# Patient Record
Sex: Female | Born: 1970 | Race: Black or African American | Hispanic: No | Marital: Married | Smoking: Never smoker
Health system: Southern US, Community
[De-identification: ages and names within clinical notes are randomized; demographics above are authoritative.]

## PROBLEM LIST (undated history)

## (undated) DIAGNOSIS — D259 Leiomyoma of uterus, unspecified: Secondary | ICD-10-CM

## (undated) HISTORY — PX: NO PAST SURGERIES: SHX2092

---

## 2006-09-26 ENCOUNTER — Encounter: Admission: RE | Admit: 2006-09-26 | Discharge: 2006-09-26 | Payer: Self-pay | Admitting: Internal Medicine

## 2009-08-28 ENCOUNTER — Encounter (INDEPENDENT_AMBULATORY_CARE_PROVIDER_SITE_OTHER): Payer: Self-pay | Admitting: Family Medicine

## 2009-08-28 ENCOUNTER — Ambulatory Visit: Payer: Self-pay | Admitting: Family Medicine

## 2009-08-28 LAB — CONVERTED CEMR LAB
Alkaline Phosphatase: 42 units/L (ref 39–117)
BUN: 8 mg/dL (ref 6–23)
CO2: 21 meq/L (ref 19–32)
Eosinophils Absolute: 0.2 10*3/uL (ref 0.0–0.7)
Eosinophils Relative: 2 % (ref 0–5)
Glucose, Bld: 94 mg/dL (ref 70–99)
HCT: 36.2 % (ref 36.0–46.0)
Hemoglobin: 12.2 g/dL (ref 12.0–15.0)
Lymphocytes Relative: 44 % (ref 12–46)
Lymphs Abs: 3.4 10*3/uL (ref 0.7–4.0)
MCV: 92.3 fL (ref 78.0–100.0)
Monocytes Absolute: 0.5 10*3/uL (ref 0.1–1.0)
Monocytes Relative: 6 % (ref 3–12)
Platelets: 298 10*3/uL (ref 150–400)
RBC: 3.92 M/uL (ref 3.87–5.11)
Total Bilirubin: 0.2 mg/dL — ABNORMAL LOW (ref 0.3–1.2)
WBC: 7.8 10*3/uL (ref 4.0–10.5)

## 2011-01-22 ENCOUNTER — Emergency Department (HOSPITAL_COMMUNITY)
Admission: EM | Admit: 2011-01-22 | Discharge: 2011-01-23 | Disposition: A | Discharge status: Home / Self Care | Payer: Self-pay | Attending: Emergency Medicine | Admitting: Emergency Medicine

## 2011-01-22 ENCOUNTER — Encounter: Payer: Self-pay | Admitting: Emergency Medicine

## 2011-01-22 DIAGNOSIS — J029 Acute pharyngitis, unspecified: Secondary | ICD-10-CM | POA: Insufficient documentation

## 2011-01-22 DIAGNOSIS — R51 Headache: Secondary | ICD-10-CM | POA: Insufficient documentation

## 2011-01-22 DIAGNOSIS — H9209 Otalgia, unspecified ear: Secondary | ICD-10-CM | POA: Insufficient documentation

## 2011-01-22 MED ORDER — SODIUM CHLORIDE 0.9 % IV BOLUS (SEPSIS)
1000.0000 mL | Freq: Once | INTRAVENOUS | Status: AC
  Administered 2011-01-22: 1000 mL via INTRAVENOUS

## 2011-01-22 NOTE — ED Notes (Signed)
Patient complaining of headache at this time and requested some tylenol.  MD aware.

## 2011-01-22 NOTE — ED Provider Notes (Signed)
History     CSN: [id number] Arrival date & time: 01/22/2011 10:22 PM   First MD Initiated Contact with Patient 01/22/11 2326      Chief Complaint  Patient presents with  . Sore Throat    (Consider location/radiation/quality/duration/timing/severity/associated sxs/prior treatment) HPI Is a 40 year old black female with a one and a half week history of sore throat. The pain is moderate to severe and located in the right side of the throat. She states it feels as if there is a lump there. The pain is worse with swallowing. The pain also radiates to the right ear. She's not aware of having a fever but her temperature was noted to be 99 7 here. She's had occasional headache with this. She's had decreased eating but states she believes she has been drinking well. She denies weakness or fatigue. She denies abdominal pain. She denies swollen glands. She was started on Zithromax 3 days ago without relief.  History reviewed. No pertinent past medical history.  History reviewed. No pertinent past surgical history.  No family history on file.  History  Substance Use Topics  . Smoking status: Never Smoker   . Smokeless tobacco: Not on file  . Alcohol Use: No    OB History    Grav Para Term Preterm Abortions TAB SAB Ect Mult Living                  Review of Systems  All other systems reviewed and are negative.    Allergies  Review of patient's allergies indicates no known allergies.  Home Medications   Current Outpatient Rx  Name Route Sig Dispense Refill  . AZITHROMYCIN 250 MG PO TABS Oral Take 250 mg by mouth daily. Take 2 on day 1, then 1 tab on days 2-5. Started on 01/19/11     . NORGESTIM-ETH ESTRAD TRIPHASIC 0.18/0.215/0.25 MG-25 MCG PO TABS Oral Take 1 tablet by mouth daily.        BP 118/79  Pulse 115  Temp 99.7 F (37.6 C)  Resp 14  SpO2 97%  LMP 12/24/2010  Physical Exam General: Well-developed, well-nourished female in no acute distress; appearance  consistent with age of record HENT: normocephalic, atraumatic; pharyngeal erythema and mild edema without exudate Eyes: pupils equal round and reactive to light; extraocular muscles intact Neck: supple; no lymphadenopathy Heart: regular rate and rhythm; no murmurs; tachycardic Lungs: clear to auscultation bilaterally Abdomen: soft; nontender; nondistended; no masses or hepatosplenomegaly; bowel sounds present Extremities: No deformity; full range of motion Neurologic: Awake, alert and oriented; motor function intact in all extremities and symmetric; no facial droop Skin: Warm and dry     ED Course  Procedures (including critical care time)    MDM   Nursing notes and vitals signs, including pulse oximetry, reviewed.  Summary of this visit's results, reviewed by myself:  Labs:  Results for orders placed during the hospital encounter of 01/22/11  RAPID STREP SCREEN      Component Value Range   Streptococcus, Group A Screen (Direct) NEGATIVE  NEGATIVE   MONONUCLEOSIS SCREEN      Component Value Range   Mono Screen NEGATIVE  NEGATIVE    12:38 AM Patient has a history of GERD in the past. GERD Some of her symptomatology although this would not be expected to cause a low-grade fever. We will administer dexamethasone in place her on a PPI. She was advised to finish her Zithromax course.          Crimson Dubberly L  Dagny Fiorentino, MD 01/23/11 1610

## 2011-01-22 NOTE — ED Notes (Signed)
PT. REPORTS SORE THROAT FOR 1 1/2 WEEKS CURRENTLY TAKING AZITHROMAX ANTIBIOTIC PRESCRIBED BY PCP WITH NO IMPROVEMENT.

## 2011-01-23 LAB — RAPID STREP SCREEN (MED CTR MEBANE ONLY): Streptococcus, Group A Screen (Direct): NEGATIVE

## 2011-01-23 LAB — MONONUCLEOSIS SCREEN: Mono Screen: NEGATIVE

## 2011-01-23 MED ORDER — LANSOPRAZOLE 15 MG PO CPDR: 15.0000 mg | DELAYED_RELEASE_CAPSULE | Freq: Every day | ORAL | Status: DC

## 2011-01-23 MED ORDER — DEXAMETHASONE SODIUM PHOSPHATE 10 MG/ML IJ SOLN
10.0000 mg | Freq: Once | INTRAMUSCULAR | Status: AC
  Administered 2011-01-23: 10 mg via INTRAVENOUS
  Filled 2011-01-23: qty 1

## 2011-01-23 MED ORDER — ACETAMINOPHEN 325 MG PO TABS
650.0000 mg | ORAL_TABLET | Freq: Once | ORAL | Status: AC
  Administered 2011-01-23: 650 mg via ORAL

## 2011-01-23 MED ORDER — ACETAMINOPHEN 325 MG PO TABS
ORAL_TABLET | ORAL | Status: AC
  Filled 2011-01-23: qty 2

## 2011-01-23 MED ORDER — PANTOPRAZOLE SODIUM 40 MG IV SOLR
40.0000 mg | Freq: Once | INTRAVENOUS | Status: AC
  Administered 2011-01-23: 40 mg via INTRAVENOUS
  Filled 2011-01-23: qty 40

## 2014-02-28 ENCOUNTER — Other Ambulatory Visit (HOSPITAL_COMMUNITY)
Admission: RE | Admit: 2014-02-28 | Discharge: 2014-02-28 | Disposition: A | Discharge status: Home / Self Care | Payer: BLUE CROSS/BLUE SHIELD | Source: Ambulatory Visit | Attending: Obstetrics & Gynecology | Admitting: Obstetrics & Gynecology

## 2014-02-28 ENCOUNTER — Other Ambulatory Visit: Payer: Self-pay | Admitting: Obstetrics & Gynecology

## 2014-02-28 DIAGNOSIS — Z01411 Encounter for gynecological examination (general) (routine) with abnormal findings: Secondary | ICD-10-CM | POA: Diagnosis present

## 2014-02-28 DIAGNOSIS — Z1151 Encounter for screening for human papillomavirus (HPV): Secondary | ICD-10-CM | POA: Diagnosis present

## 2014-03-05 LAB — CYTOLOGY - PAP

## 2015-12-17 ENCOUNTER — Other Ambulatory Visit: Payer: Self-pay | Admitting: Obstetrics & Gynecology

## 2015-12-17 DIAGNOSIS — Z1231 Encounter for screening mammogram for malignant neoplasm of breast: Secondary | ICD-10-CM

## 2015-12-31 ENCOUNTER — Ambulatory Visit
Admission: RE | Admit: 2015-12-31 | Discharge: 2015-12-31 | Disposition: A | Discharge status: Home / Self Care | Payer: BLUE CROSS/BLUE SHIELD | Source: Ambulatory Visit | Attending: Obstetrics & Gynecology | Admitting: Obstetrics & Gynecology

## 2015-12-31 DIAGNOSIS — Z1231 Encounter for screening mammogram for malignant neoplasm of breast: Secondary | ICD-10-CM

## 2018-03-31 ENCOUNTER — Other Ambulatory Visit: Payer: Self-pay | Admitting: Obstetrics & Gynecology

## 2018-03-31 DIAGNOSIS — Z1231 Encounter for screening mammogram for malignant neoplasm of breast: Secondary | ICD-10-CM

## 2018-05-04 ENCOUNTER — Ambulatory Visit: Payer: BLUE CROSS/BLUE SHIELD

## 2019-09-19 ENCOUNTER — Other Ambulatory Visit: Payer: Self-pay | Admitting: Obstetrics & Gynecology

## 2019-09-19 DIAGNOSIS — Z1231 Encounter for screening mammogram for malignant neoplasm of breast: Secondary | ICD-10-CM

## 2019-10-04 ENCOUNTER — Other Ambulatory Visit: Payer: Self-pay

## 2019-10-04 ENCOUNTER — Ambulatory Visit
Admission: RE | Admit: 2019-10-04 | Discharge: 2019-10-04 | Disposition: A | Discharge status: Home / Self Care | Payer: No Typology Code available for payment source | Source: Ambulatory Visit | Attending: Obstetrics & Gynecology | Admitting: Obstetrics & Gynecology

## 2019-10-04 DIAGNOSIS — Z1231 Encounter for screening mammogram for malignant neoplasm of breast: Secondary | ICD-10-CM

## 2020-03-19 ENCOUNTER — Other Ambulatory Visit: Payer: Self-pay | Admitting: Internal Medicine

## 2020-03-20 LAB — COMPLETE METABOLIC PANEL WITH GFR
AG Ratio: 1.3 (calc) (ref 1.0–2.5)
ALT: 15 U/L (ref 6–29)
AST: 16 U/L (ref 10–35)
Albumin: 4.2 g/dL (ref 3.6–5.1)
Alkaline phosphatase (APISO): 38 U/L (ref 31–125)
BUN: 10 mg/dL (ref 7–25)
CO2: 26 mmol/L (ref 20–32)
Calcium: 9.5 mg/dL (ref 8.6–10.2)
Chloride: 101 mmol/L (ref 98–110)
Creat: 0.69 mg/dL (ref 0.50–1.10)
GFR, Est African American: 119 mL/min/{1.73_m2} (ref >=60)
GFR, Est Non African American: 103 mL/min/{1.73_m2} (ref >=60)
Globulin: 3.3 g/dL (calc) (ref 1.9–3.7)
Glucose, Bld: 73 mg/dL (ref 65–99)
Potassium: 4.4 mmol/L (ref 3.5–5.3)
Sodium: 134 mmol/L — ABNORMAL LOW (ref 135–146)
Total Bilirubin: 0.6 mg/dL (ref 0.2–1.2)
Total Protein: 7.5 g/dL (ref 6.1–8.1)

## 2020-03-20 LAB — IRON, TOTAL/TOTAL IRON BINDING CAP
%SAT: 35 % (calc) (ref 16–45)
Iron: 160 ug/dL (ref 40–190)
TIBC: 462 mcg/dL (calc) — ABNORMAL HIGH (ref 250–450)

## 2020-03-20 LAB — TSH: TSH: 1.92 mIU/L

## 2020-03-20 LAB — CBC
HCT: 40.2 % (ref 35.0–45.0)
Hemoglobin: 13.7 g/dL (ref 11.7–15.5)
MCH: 31.2 pg (ref 27.0–33.0)
MCHC: 34.1 g/dL (ref 32.0–36.0)
MCV: 91.6 fL (ref 80.0–100.0)
MPV: 10 fL (ref 7.5–12.5)
Platelets: 308 10*3/uL (ref 140–400)
RBC: 4.39 10*6/uL (ref 3.80–5.10)
RDW: 12.5 % (ref 11.0–15.0)
WBC: 5.8 10*3/uL (ref 3.8–10.8)

## 2020-03-20 LAB — LIPID PANEL
Cholesterol: 167 mg/dL (ref <200)
HDL: 67 mg/dL (ref >=50)
LDL Cholesterol (Calc): 88 mg/dL (calc)
Non-HDL Cholesterol (Calc): 100 mg/dL (calc) (ref <130)
Total CHOL/HDL Ratio: 2.5 (calc) (ref <5.0)
Triglycerides: 40 mg/dL (ref <150)

## 2020-03-20 LAB — FERRITIN: Ferritin: 35 ng/mL (ref 16–232)

## 2020-03-20 LAB — RETICULOCYTES
ABS Retic: 61460 cells/uL (ref 20000–8000)
Retic Ct Pct: 1.4 %

## 2020-03-20 LAB — SICKLE CELL SCREEN: Sickle Solubility Test - HGBRFX: NEGATIVE

## 2020-03-20 LAB — VITAMIN D 25 HYDROXY (VIT D DEFICIENCY, FRACTURES): Vit D, 25-Hydroxy: 14 ng/mL — ABNORMAL LOW (ref 30–100)

## 2020-03-20 LAB — B12 AND FOLATE PANEL
Folate: 17.3 ng/mL
Vitamin B-12: 409 pg/mL (ref 200–1100)

## 2021-05-18 ENCOUNTER — Other Ambulatory Visit: Payer: Self-pay | Admitting: Internal Medicine

## 2021-05-18 ENCOUNTER — Other Ambulatory Visit: Payer: Self-pay | Admitting: Obstetrics & Gynecology

## 2021-05-18 DIAGNOSIS — Z1231 Encounter for screening mammogram for malignant neoplasm of breast: Secondary | ICD-10-CM

## 2021-05-29 ENCOUNTER — Ambulatory Visit
Admission: RE | Admit: 2021-05-29 | Discharge: 2021-05-29 | Disposition: A | Discharge status: Home / Self Care | Payer: No Typology Code available for payment source | Source: Ambulatory Visit | Attending: Internal Medicine | Admitting: Internal Medicine

## 2021-05-29 ENCOUNTER — Other Ambulatory Visit: Payer: Self-pay | Admitting: Pediatrics

## 2021-05-29 DIAGNOSIS — Z1231 Encounter for screening mammogram for malignant neoplasm of breast: Secondary | ICD-10-CM

## 2021-12-17 ENCOUNTER — Emergency Department (HOSPITAL_COMMUNITY): Payer: No Typology Code available for payment source

## 2021-12-17 ENCOUNTER — Emergency Department (HOSPITAL_COMMUNITY)
Admission: EM | Admit: 2021-12-17 | Discharge: 2021-12-17 | Disposition: A | Discharge status: Home / Self Care | Payer: No Typology Code available for payment source | Attending: Emergency Medicine | Admitting: Emergency Medicine

## 2021-12-17 ENCOUNTER — Other Ambulatory Visit: Payer: Self-pay

## 2021-12-17 ENCOUNTER — Encounter (HOSPITAL_COMMUNITY): Payer: Self-pay

## 2021-12-17 DIAGNOSIS — R7401 Elevation of levels of liver transaminase levels: Secondary | ICD-10-CM | POA: Insufficient documentation

## 2021-12-17 DIAGNOSIS — R160 Hepatomegaly, not elsewhere classified: Secondary | ICD-10-CM | POA: Insufficient documentation

## 2021-12-17 DIAGNOSIS — R197 Diarrhea, unspecified: Secondary | ICD-10-CM | POA: Insufficient documentation

## 2021-12-17 DIAGNOSIS — R110 Nausea: Secondary | ICD-10-CM | POA: Insufficient documentation

## 2021-12-17 DIAGNOSIS — R1013 Epigastric pain: Secondary | ICD-10-CM | POA: Insufficient documentation

## 2021-12-17 HISTORY — DX: Leiomyoma of uterus, unspecified: D25.9

## 2021-12-17 LAB — COMPREHENSIVE METABOLIC PANEL
ALT: 90 U/L — ABNORMAL HIGH (ref 0–44)
AST: 72 U/L — ABNORMAL HIGH (ref 15–41)
Albumin: 3.9 g/dL (ref 3.5–5.0)
Alkaline Phosphatase: 37 U/L — ABNORMAL LOW (ref 38–126)
Anion gap: 7 (ref 5–15)
BUN: 8 mg/dL (ref 6–20)
CO2: 25 mmol/L (ref 22–32)
Calcium: 9.2 mg/dL (ref 8.9–10.3)
Chloride: 107 mmol/L (ref 98–111)
Creatinine, Ser: 0.73 mg/dL (ref 0.44–1.00)
GFR, Estimated: >60 mL/min (ref >60)
Glucose, Bld: 110 mg/dL — ABNORMAL HIGH (ref 70–99)
Potassium: 4 mmol/L (ref 3.5–5.1)
Sodium: 139 mmol/L (ref 135–145)
Total Bilirubin: 0.3 mg/dL (ref 0.3–1.2)
Total Protein: 7.3 g/dL (ref 6.5–8.1)

## 2021-12-17 LAB — CBC WITH DIFFERENTIAL/PLATELET
Abs Immature Granulocytes: 0.03 10*3/uL (ref 0.00–0.07)
Basophils Absolute: 0 10*3/uL (ref 0.0–0.1)
Basophils Relative: 1 %
Eosinophils Absolute: 0 10*3/uL (ref 0.0–0.5)
Eosinophils Relative: 0 %
HCT: 37.3 % (ref 36.0–46.0)
Hemoglobin: 12.7 g/dL (ref 12.0–15.0)
Immature Granulocytes: 1 %
Lymphocytes Relative: 21 %
Lymphs Abs: 1.4 10*3/uL (ref 0.7–4.0)
MCH: 32.2 pg (ref 26.0–34.0)
MCHC: 34 g/dL (ref 30.0–36.0)
MCV: 94.4 fL (ref 80.0–100.0)
Monocytes Absolute: 0.3 10*3/uL (ref 0.1–1.0)
Monocytes Relative: 4 %
Neutro Abs: 4.9 10*3/uL (ref 1.7–7.7)
Neutrophils Relative %: 73 %
Platelets: 256 10*3/uL (ref 150–400)
RBC: 3.95 MIL/uL (ref 3.87–5.11)
RDW: 12.5 % (ref 11.5–15.5)
WBC: 6.6 10*3/uL (ref 4.0–10.5)
nRBC: 0 % (ref 0.0–0.2)

## 2021-12-17 LAB — LIPASE, BLOOD: Lipase: 30 U/L (ref 11–51)

## 2021-12-17 LAB — URINALYSIS, ROUTINE W REFLEX MICROSCOPIC
Bilirubin Urine: NEGATIVE
Glucose, UA: NEGATIVE mg/dL
Hgb urine dipstick: NEGATIVE
Ketones, ur: NEGATIVE mg/dL
Nitrite: NEGATIVE
Protein, ur: 30 mg/dL — AB
Specific Gravity, Urine: 1.018 (ref 1.005–1.030)
pH: 7 (ref 5.0–8.0)

## 2021-12-17 LAB — TROPONIN I (HIGH SENSITIVITY)
Troponin I (High Sensitivity): 6 ng/L (ref <18)
Troponin I (High Sensitivity): 6 ng/L (ref <18)

## 2021-12-17 MED ORDER — IOHEXOL 350 MG/ML SOLN
70.0000 mL | Freq: Once | INTRAVENOUS | Status: AC | PRN
  Administered 2021-12-17: 70 mL via INTRAVENOUS

## 2021-12-17 MED ORDER — TRAMADOL HCL 50 MG PO TABS: 50.0000 mg | ORAL_TABLET | Freq: Four times a day (QID) | ORAL | 0 refills | Status: DC | PRN

## 2021-12-17 MED ORDER — ONDANSETRON HCL 4 MG/2ML IJ SOLN
4.0000 mg | Freq: Once | INTRAMUSCULAR | Status: AC
  Administered 2021-12-17: 4 mg via INTRAVENOUS
  Filled 2021-12-17 (×2): qty 2

## 2021-12-17 MED ORDER — OXYCODONE HCL 5 MG PO TABS: 5.0000 mg | ORAL_TABLET | Freq: Four times a day (QID) | ORAL | 0 refills | Status: DC | PRN

## 2021-12-17 MED ORDER — SODIUM CHLORIDE 0.9 % IV BOLUS
1000.0000 mL | Freq: Once | INTRAVENOUS | Status: AC
  Administered 2021-12-17: 1000 mL via INTRAVENOUS

## 2021-12-17 MED ORDER — ACETAMINOPHEN 325 MG PO TABS
650.0000 mg | ORAL_TABLET | Freq: Once | ORAL | Status: AC
  Administered 2021-12-17: 650 mg via ORAL
  Filled 2021-12-17: qty 2

## 2021-12-17 MED ORDER — KETOROLAC TROMETHAMINE 15 MG/ML IJ SOLN
15.0000 mg | Freq: Once | INTRAMUSCULAR | Status: AC
  Administered 2021-12-17: 15 mg via INTRAVENOUS
  Filled 2021-12-17: qty 1

## 2021-12-17 MED ORDER — TRAMADOL HCL 50 MG PO TABS
50.0000 mg | ORAL_TABLET | Freq: Once | ORAL | Status: AC
  Administered 2021-12-17: 50 mg via ORAL
  Filled 2021-12-17: qty 1

## 2021-12-17 MED ORDER — ONDANSETRON 4 MG PO TBDP: ORAL_TABLET | ORAL | 0 refills | Status: DC

## 2021-12-17 MED ORDER — OXYCODONE HCL 5 MG PO TABS
5.0000 mg | ORAL_TABLET | Freq: Once | ORAL | Status: DC
  Filled 2021-12-17: qty 1

## 2021-12-17 NOTE — ED Notes (Signed)
Pt reported feeling nausea, requested previously refused ondansetron

## 2021-12-17 NOTE — ED Provider Triage Note (Signed)
Emergency Medicine Provider Triage Evaluation Note  Carol Chan , a 51 y.o. female  was evaluated in triage.  Pt complains of epigastric pain.  Patient notes acute onset epigastric pain around 10/10:30 this morning when she was sitting at her desk.  She notes persistence of symptoms since onset.  Pain is described as cramping/pressure without radiation.  Denies associated nausea, vomiting, urinary/vaginal symptoms, change in bowel habits.  Denies chest pain, shortness of breath.  Denies any known relieving or exacerbating factors.  Denies fever, chills, night sweats.  She has taken no medication for this. Review of Systems  Positive: See above Negative:   Physical Exam  BP (!) 163/83 (BP Location: Right Arm)   Pulse 88   Temp 98.8 F (37.1 C) (Oral)   Resp 17   Ht '5\' 6"'$  (1.676 m)   Wt 75.3 kg   LMP 11/04/2021   SpO2 98%   BMI 26.79 kg/m  Gen:   Awake, no distress   Resp:  Normal effort  MSK:   Moves extremities without difficulty  Other:  No epigastric tenderness to palpation.  No other abdominal tenderness to palpation.  No CVA tenderness bilaterally.  No lower extremity edema noted.  Lungs clear to auscultation bilaterally.  No obvious murmurs gallops or rubs.  Medical Decision Making  Medically screening exam initiated at 1:45 PM.  Appropriate orders placed.  Lannie Heaps was informed that the remainder of the evaluation will be completed by another provider, this initial triage assessment does not replace that evaluation, and the importance of remaining in the ED until their evaluation is complete.     Wilnette Kales, [place] 12/17/21 1348

## 2021-12-17 NOTE — ED Triage Notes (Signed)
Pt c/o sudden onset epigastric pain that started at 10am. Pt denies N/V/D, or ShOB. Pt state she was sitting down at work when the pain started.

## 2021-12-17 NOTE — ED Provider Notes (Signed)
[facility] EMERGENCY DEPARTMENT Provider Note   CSN: [id number] Arrival date & time: 12/17/21  1304     History  Chief Complaint  Patient presents with   Abdominal Pain    Carol Chan is a 51 y.o. female here presenting with abdominal pain.  Patient states that she has not been feeling well this morning.  She states that she had a cute onset of epigastric pain and general abdominal cramps.  She states that she has been having some loose stools and nausea as well.  Patient denies any fever and denies eating any uncooked food.  The history is provided by the patient.       Home Medications Prior to Admission medications   Medication Sig Start Date End Date Taking? Authorizing Provider  azithromycin (ZITHROMAX) 250 MG tablet Take 250 mg by mouth daily. Take 2 on day 1, then 1 tab on days 2-5. Started on 01/19/11     [provider]  lansoprazole (PREVACID 24HR) 15 MG capsule Take 1 capsule (15 mg total) by mouth daily. 01/23/11 01/23/12  Molpus, John, MD  Norgestimate-Ethinyl Estradiol Triphasic (ORTHO TRI-CYCLEN LO) 0.18/0.215/0.25 MG-25 MCG tablet Take 1 tablet by mouth daily.      [provider]      Allergies    Patient has no known allergies.    Review of Systems   Review of Systems  Gastrointestinal:  Positive for abdominal pain.  All other systems reviewed and are negative.   Physical Exam Updated Vital Signs BP (!) 159/83   Pulse 79   Temp 98.8 F (37.1 C) (Oral)   Resp 20   Ht '5\' 6"'$  (1.676 m)   Wt 75.3 kg   LMP 11/04/2021   SpO2 100%   BMI 26.79 kg/m  Physical Exam Vitals and nursing note reviewed.  Constitutional:      Comments: Slightly uncomfortable and dehydrated  HENT:     Head: Normocephalic.     Mouth/Throat:     Pharynx: Oropharynx is clear.  Eyes:     Extraocular Movements: Extraocular movements intact.     Pupils: Pupils are equal, round, and reactive to light.  Cardiovascular:     Rate and Rhythm:  Normal rate and regular rhythm.  Pulmonary:     Effort: Pulmonary effort is normal.     Breath sounds: Normal breath sounds.  Abdominal:     General: Abdomen is flat.     Comments: Mild periumbilical tenderness  Skin:    General: Skin is warm.     Capillary Refill: Capillary refill takes less than 2 seconds.  Neurological:     General: No focal deficit present.     Mental Status: She is alert and oriented to person, place, and time.  Psychiatric:        Mood and Affect: Mood normal.        Behavior: Behavior normal.     ED Results / Procedures / Treatments   Labs (all labs ordered are listed, but only abnormal results are displayed) Labs Reviewed  COMPREHENSIVE METABOLIC PANEL - Abnormal; Notable for the following components:      Result Value   Glucose, Bld 110 (*)    AST 72 (*)    ALT 90 (*)    Alkaline Phosphatase 37 (*)    All other components within normal limits  URINALYSIS, ROUTINE W REFLEX MICROSCOPIC - Abnormal; Notable for the following components:   APPearance HAZY (*)    Protein, ur 30 (*)  Leukocytes,Ua TRACE (*)    Bacteria, UA RARE (*)    All other components within normal limits  CBC WITH DIFFERENTIAL/PLATELET  LIPASE, BLOOD  TROPONIN I (HIGH SENSITIVITY)  TROPONIN I (HIGH SENSITIVITY)    EKG EKG Interpretation  Date/Time:  Thursday December 17 2021 13:29:35 EST Ventricular Rate:  83 PR Interval:  168 QRS Duration: 84 QT Interval:  388 QTC Calculation: 455 R Axis:   10 Text Interpretation: Normal sinus rhythm Normal ECG No previous ECGs available Confirmed by Wandra Arthurs [phone]) on 12/17/2021 3:54:58 PM  Radiology CT ABDOMEN PELVIS W CONTRAST  Result Date: 12/17/2021 CLINICAL DATA:  Nausea and vomiting. Epigastric pain. EXAM: CT ABDOMEN AND PELVIS WITH CONTRAST TECHNIQUE: Multidetector CT imaging of the abdomen and pelvis was performed using the standard protocol following bolus administration of intravenous contrast. RADIATION DOSE  REDUCTION: This exam was performed according to the departmental dose-optimization program which includes automated exposure control, adjustment of the mA and/or kV according to patient size and/or use of iterative reconstruction technique. CONTRAST:  70m OMNIPAQUE IOHEXOL 350 MG/ML SOLN COMPARISON:  Remote ultrasound 09/26/2006 FINDINGS: Lower chest: No focal airspace disease or pleural effusion. Hepatobiliary: Large ill-defined heterogeneous lesion in the right hepatic lobe is irregular in shape, however measures 8.2 x 8.2 cm, series 3, image 23. There is some slightly nodular enhancement. No additional liver lesion. Gallbladder physiologically distended, no calcified stone. No biliary dilatation. Pancreas: Unremarkable. No pancreatic ductal dilatation or surrounding inflammatory changes. Spleen: Normal in size without focal abnormality. Adrenals/Urinary Tract: Normal adrenal glands. No hydronephrosis or perinephric edema. Homogeneous renal enhancement with symmetric excretion on delayed phase imaging. No renal calculi. There are small bilateral low-density lesions in both kidneys are too small to characterize but typically cysts. No specific imaging follow-up is needed. Urinary bladder is minimally distended. Stomach/Bowel: Fluid distended stomach. No gastric wall thickening. No small bowel obstruction or inflammation. Normal appendix. Small volume of colonic stool. Majority of the distal transverse and descending colon are decompressed limiting assessment. There is no obvious colonic mass. Vascular/Lymphatic: Normal caliber abdominal aorta. Patent portal and splenic veins. No bulky abdominopelvic adenopathy. Periportal nodes are not enlarged by size criteria. Reproductive: The uterus is enlarged spanning 18 cm cranial caudal to the level of the umbilicus. Diffuse uterine fibroids. The ovaries are not well-defined by CT. Other: No ascites. No free air. Tiny fat containing umbilical hernia. Musculoskeletal: There  are no acute or suspicious osseous abnormalities. Transitional lumbosacral anatomy. IMPRESSION: 1. Large ill-defined heterogeneous lesion in the right hepatic lobe measuring 8.2 x 8.2 cm. Recommend MRI characterization. No lesion was seen on remote 2008 ultrasound. 2. Enlarged fibroid uterus. Electronically Signed   By: MKeith RakeM.D.   On: 12/17/2021 19:09   DG Chest 1 View  Result Date: 12/17/2021 CLINICAL DATA:  Chest pain and abdominal pain for 1 day EXAM: CHEST  1 VIEW COMPARISON:  None Available. FINDINGS: Upper normal heart size. Mediastinal contours and pulmonary vascularity normal. Lungs clear. No infiltrate, pleural effusion, or pneumothorax. Osseous structures unremarkable. IMPRESSION: No acute abnormalities. Electronically Signed   By: MLavonia DanaM.D.   On: 12/17/2021 14:30    Procedures Procedures    Medications Ordered in ED Medications  acetaminophen (TYLENOL) tablet 650 mg (650 mg Oral Given 12/17/21 1432)  sodium chloride 0.9 % bolus 1,000 mL (1,000 mLs Intravenous New Bag/Given 12/17/21 1711)  ondansetron (ZOFRAN) injection 4 mg (4 mg Intravenous Given 12/17/21 1849)  ketorolac (TORADOL) 15 MG/ML injection 15 mg (15 mg Intravenous  Given 12/17/21 1734)  iohexol (OMNIPAQUE) 350 MG/ML injection 70 mL (70 mLs Intravenous Contrast Given 12/17/21 1842)    ED Course/ Medical Decision Making/ A&P                           Medical Decision Making Carol Chan is a 51 y.o. female here presenting with abdominal pain and nausea.  Likely viral gastroenteritis.  Plan to get CBC and CMP and lipase and CT abdomen pelvis.  Will hydrate and reassess.  7:29 PM Reviewed patient's labs and independently interpreted CT scan.  Labs showed minimally elevated LFTs.  Lipase is normal.  CT abdomen pelvis showed large ill-defined heterogeneous lesion in the right hepatic lobe.  I referred patient to GI to get MRI and possible colonoscopy or further biopsy.  Told her to avoid Tylenol for now and  take ibuprofen and oxycodone as needed.  Problems Addressed: Liver mass: acute illness or injury  Amount and/or Complexity of Data Reviewed Labs: ordered. Decision-making details documented in ED Course. Radiology: ordered and independent interpretation performed. Decision-making details documented in ED Course.  Risk Prescription drug management.    Final Clinical Impression(s) / ED Diagnoses Final diagnoses:  None    Rx / DC Orders ED Discharge Orders     None         Drenda Freeze, MD 12/17/21 1931

## 2021-12-17 NOTE — ED Notes (Signed)
Pt refusing oxycodone, requesting tramadol. Dr. Darl Householder aware.

## 2021-12-17 NOTE — Discharge Instructions (Addendum)
As we discussed, you have a liver mass on your CT scan.  I referred you to a GI doctor to get an MRI and further evaluation.  Your liver enzymes are slightly abnormal I want you to avoid Tylenol for now.  Take Motrin for pain and take Roxicodone for severe pain.  You can take tramadol instead of Roxicodone if you want   You can take Zofran for nausea  Stay hydrated  See your doctor for follow-up and please call GI doctor for follow-up  Return to ER if you have severe abdominal pain, vomiting, fever

## 2021-12-17 NOTE — ED Notes (Signed)
Pt updated on plan of care while in lobby

## 2021-12-18 ENCOUNTER — Encounter: Payer: Self-pay | Admitting: Nurse Practitioner

## 2022-01-22 ENCOUNTER — Encounter: Payer: Self-pay | Admitting: Nurse Practitioner

## 2022-01-22 ENCOUNTER — Ambulatory Visit (INDEPENDENT_AMBULATORY_CARE_PROVIDER_SITE_OTHER): Payer: No Typology Code available for payment source | Admitting: Nurse Practitioner

## 2022-01-22 ENCOUNTER — Other Ambulatory Visit (INDEPENDENT_AMBULATORY_CARE_PROVIDER_SITE_OTHER): Payer: No Typology Code available for payment source

## 2022-01-22 VITALS — BP 128/72 | HR 95 | Ht 66.0 in | Wt 164.0 lb

## 2022-01-22 DIAGNOSIS — Z1211 Encounter for screening for malignant neoplasm of colon: Secondary | ICD-10-CM

## 2022-01-22 DIAGNOSIS — R7989 Other specified abnormal findings of blood chemistry: Secondary | ICD-10-CM

## 2022-01-22 DIAGNOSIS — R160 Hepatomegaly, not elsewhere classified: Secondary | ICD-10-CM

## 2022-01-22 LAB — COMPREHENSIVE METABOLIC PANEL
ALT: 21 U/L (ref 0–35)
AST: 15 U/L (ref 0–37)
Albumin: 4 g/dL (ref 3.5–5.2)
Alkaline Phosphatase: 50 U/L (ref 39–117)
BUN: 10 mg/dL (ref 6–23)
CO2: 26 mEq/L (ref 19–32)
Calcium: 9.5 mg/dL (ref 8.4–10.5)
Chloride: 103 mEq/L (ref 96–112)
Creatinine, Ser: 0.74 mg/dL (ref 0.40–1.20)
GFR: 94.32 mL/min (ref >60.00)
Glucose, Bld: 89 mg/dL (ref 70–99)
Potassium: 3.6 mEq/L (ref 3.5–5.1)
Sodium: 135 mEq/L (ref 135–145)
Total Bilirubin: 0.3 mg/dL (ref 0.2–1.2)
Total Protein: 7.8 g/dL (ref 6.0–8.3)

## 2022-01-22 LAB — CBC WITH DIFFERENTIAL/PLATELET
Basophils Absolute: 0.1 10*3/uL (ref 0.0–0.1)
Basophils Relative: 1.3 % (ref 0.0–3.0)
Eosinophils Absolute: 0.2 10*3/uL (ref 0.0–0.7)
Eosinophils Relative: 2.5 % (ref 0.0–5.0)
HCT: 38.2 % (ref 36.0–46.0)
Hemoglobin: 12.6 g/dL (ref 12.0–15.0)
Lymphocytes Relative: 30.9 % (ref 12.0–46.0)
Lymphs Abs: 2.1 10*3/uL (ref 0.7–4.0)
MCHC: 33 g/dL (ref 30.0–36.0)
MCV: 92.5 fl (ref 78.0–100.0)
Monocytes Absolute: 0.4 10*3/uL (ref 0.1–1.0)
Monocytes Relative: 6.3 % (ref 3.0–12.0)
Neutro Abs: 3.9 10*3/uL (ref 1.4–7.7)
Neutrophils Relative %: 59 % (ref 43.0–77.0)
Platelets: 356 10*3/uL (ref 150.0–400.0)
RBC: 4.13 Mil/uL (ref 3.87–5.11)
RDW: 12.9 % (ref 11.5–15.5)
WBC: 6.6 10*3/uL (ref 4.0–10.5)

## 2022-01-22 LAB — PROTIME-INR
INR: 1.1 ratio — ABNORMAL HIGH (ref 0.8–1.0)
Prothrombin Time: 12.5 s (ref 9.6–13.1)

## 2022-01-22 NOTE — Progress Notes (Signed)
01/22/2022 Carol Chan [id number] 02-Feb-1971   CHIEF COMPLAINT: Abdominal pain.  HISTORY OF PRESENT ILLNESS: Carol Chan is a 51 year female with a past medical history of uterine fibroids otherwise noncontributory.  No past surgical history.  She presents to our office today as referred by Dr. Shirlyn Chan for further evaluation regarding a right liver mass and to schedule a screening colonoscopy. 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  Laboratory studies in the ED showed a WBC count of 6.6.  Hemoglobin 12.7. Alk phos 37.  AST 72.  ALT 90.  Total bili 0.3.  CTAP with contrast identified a large liver mass measuring 8.2 x 8.2 cm with nodular enhancement and diffuse uterine fibroids.  No colon mass.  She received IV fluids, Zofran, Toradol and her abdominal pain abated.  She was discharged home with instructions to follow-up with GI to further evaluate her liver mass and to schedule a screening colonoscopy.  She denies having any further abdominal pain since her ED evaluation. She denies having any history of liver lesions/FNH or liver disease in the past.  She has been on oral birth control pills since for 29 years.  No alcohol or drug use.  No family history of liver disease.  No fever, sweats or chills.  No unexplained weight loss.  No nausea or vomiting.  No heartburn or dysphagia.  He is passing normal formed brown bowel movement once daily.  Her half brother was diagnosed with colon cancer in his 101s.  She denies ever a screening colonoscopy.     Latest Ref Rng & Units 12/17/2021    1:50 PM 03/19/2020    2:50 PM 08/28/2009    9:38 PM  CBC  WBC 4.0 - 10.5 K/uL 6.6  5.8  7.8   Hemoglobin 12.0 - 15.0 g/dL 12.7  13.7  12.2   Hematocrit 36.0 - 46.0 % 37.3  40.2  36.2   Platelets 150 - 400 K/uL 256  308  298        Latest Ref Rng & Units 12/17/2021    1:50 PM 03/19/2020    2:50 PM  08/28/2009    9:38 PM  CMP  Glucose 70 - 99 mg/dL 110  73  94   BUN 6 - 20 mg/dL _0 Creatinine 0.44 - 1.00 mg/dL 0.73  0.69  0.60   Sodium 135 - 145 mmol/L 139  134  139   Potassium 3.5 - 5.1 mmol/L 4.0  4.4  4.0   Chloride 98 - 111 mmol/L 107  101  106   CO2 22 - 32 mmol/L _1 Calcium 8.9 - 10.3 mg/dL 9.2  9.5  8.7   Total Protein 6.5 - 8.1 g/dL 7.3  7.5  7.0   Total Bilirubin 0.3 - 1.2 mg/dL 0.3  0.6  0.2   Alkaline Phos 38 - 126 U/L 37   42   AST 15 - 41 U/L 72  16  11   ALT 0 - 44 U/L 90  15  9     CTAP 12/17/2021:   EXAM: CT ABDOMEN AND PELVIS WITH CONTRAST   TECHNIQUE: Multidetector CT imaging of the abdomen and pelvis was performed using the standard protocol following bolus administration of intravenous contrast.   RADIATION DOSE REDUCTION: This exam was performed according to  the departmental dose-optimization program which includes automated exposure control, adjustment of the mA and/or kV according to patient size and/or use of iterative reconstruction technique.   CONTRAST:  35m OMNIPAQUE IOHEXOL 350 MG/ML SOLN   COMPARISON:  Remote ultrasound 09/26/2006   FINDINGS: Lower chest: No focal airspace disease or pleural effusion.   Hepatobiliary: Large ill-defined heterogeneous lesion in the right hepatic lobe is irregular in shape, however measures 8.2 x 8.2 cm, series 3, image 23. There is some slightly nodular enhancement. No additional liver lesion. Gallbladder physiologically distended, no calcified stone. No biliary dilatation.   Pancreas: Unremarkable. No pancreatic ductal dilatation or surrounding inflammatory changes.   Spleen: Normal in size without focal abnormality.   Adrenals/Urinary Tract: Normal adrenal glands. No hydronephrosis or perinephric edema. Homogeneous renal enhancement with symmetric excretion on delayed phase imaging. No renal calculi. There are small bilateral low-density lesions in both kidneys are too small  to characterize but typically cysts. No specific imaging follow-up is needed. Urinary bladder is minimally distended.   Stomach/Bowel: Fluid distended stomach. No gastric wall thickening. No small bowel obstruction or inflammation. Normal appendix. Small volume of colonic stool. Majority of the distal transverse and descending colon are decompressed limiting assessment. There is no obvious colonic mass.   Vascular/Lymphatic: Normal caliber abdominal aorta. Patent portal and splenic veins. No bulky abdominopelvic adenopathy. Periportal nodes are not enlarged by size criteria.   Reproductive: The uterus is enlarged spanning 18 cm cranial caudal to the level of the umbilicus. Diffuse uterine fibroids. The ovaries are not well-defined by CT.   Other: No ascites. No free air. Tiny fat containing umbilical hernia.   Musculoskeletal: There are no acute or suspicious osseous abnormalities. Transitional lumbosacral anatomy.   IMPRESSION: 1. Large ill-defined heterogeneous lesion in the right hepatic lobe measuring 8.2 x 8.2 cm. Recommend MRI characterization. No lesion was seen on remote 2008 ultrasound. 2. Enlarged fibroid uterus.   Past Medical History:  Diagnosis Date   Uterine fibroid    Past Surgical History:  Procedure Laterality Date   NO PAST SURGERIES     Social History: She is married.  She has 1 son and 2 daughters.  She is a rEquities trader  Non-smoker.  No alcohol use.  No drug use.  Family History: No family history of. Paternal cousin had colon cancer.  Half brother had colon cancer, diagnosed in late 539's   No Known Allergies    Outpatient Encounter Medications as of 01/22/2022  Medication Sig   Norgestimate-Ethinyl Estradiol Triphasic (ORTHO TRI-CYCLEN LO) 0.18/0.215/0.25 MG-25 MCG tablet Take 1 tablet by mouth daily.     [DISCONTINUED] azithromycin (ZITHROMAX) 250 MG tablet Take 250 mg by mouth daily. Take 2 on day 1, then 1 tab on days 2-5. Started on  01/19/11    [DISCONTINUED] lansoprazole (PREVACID 24HR) 15 MG capsule Take 1 capsule (15 mg total) by mouth daily.   [DISCONTINUED] ondansetron (ZOFRAN-ODT) 4 MG disintegrating tablet 416mODT q4 hours prn nausea/vomit   [DISCONTINUED] oxyCODONE (ROXICODONE) 5 MG immediate release tablet Take 1 tablet (5 mg total) by mouth every 6 (six) hours as needed for severe pain.   [DISCONTINUED] traMADol (ULTRAM) 50 MG tablet Take 1 tablet (50 mg total) by mouth every 6 (six) hours as needed.   No facility-administered encounter medications on file as of 01/22/2022.   REVIEW OF SYSTEMS:  Gen: Denies fever, sweats or chills. No weight loss.  CV: Denies chest pain, palpitations or edema. Resp: Denies cough, shortness of breath of hemoptysis.  GI: See HPI.  GU : + Uterine fibroids. Denies urinary burning, blood in urine, increased urinary frequency or incontinence. MS: Denies joint pain, muscles aches or weakness. Derm: Denies rash, itchiness, skin lesions or unhealing ulcers. Psych: Denies depression, anxiety or memory loss. Heme: Denies bruising, easy bleeding. Neuro:  Denies headaches, dizziness or paresthesias. Endo:  Denies any problems with DM, thyroid or adrenal function.  PHYSICAL EXAM: BP 128/72   Pulse 95   Ht _0  (1.676 m)   Wt 164 lb (74.4 kg)   BMI 26.47 kg/m  General: 51 year old female in no acute distress. Head: Normocephalic and atraumatic. Eyes:  Sclerae non-icteric, conjunctive pink. Ears: Normal auditory acuity. Mouth: Dentition intact. No ulcers or lesions.  Neck: Supple, no lymphadenopathy or thyromegaly.  Lungs: Clear bilaterally to auscultation without wheezes, crackles or rhonchi. Heart: Regular rate and rhythm. No murmur, rub or gallop appreciated.  Abdomen: Soft, nontender, non distended. Firm area to the RLQ, suspect d/t uterine fibroids. No hepatosplenomegaly. Normoactive bowel sounds x 4 quadrants.  Rectal: Deferred. Musculoskeletal: Symmetrical with no gross  deformities. Skin: Warm and dry. No rash or lesions on visible extremities. Extremities: No edema. Neurological: Alert oriented x 4, no focal deficits.  Psychological:  Alert and cooperative. Normal mood and affect.  ASSESSMENT AND PLAN:  7) 51 year old female with an 8.2 x 8.2 right liver mass with elevated AST and ALT levels. Normal T. Bili and Alk phos levels.  -Abdominal MRI with and without contrast to further evaluate right liver mass -Possible liver biopsy, await MRI results -CBC, CMP, AFP, hepatitis B surface antigen, hepatitis B core antibody, hepatitis B core total antibody, hepatitis C antibody AFP and INR.  Hepatitis A antibody, if not immune she will require vaccination. -Further recommendations to be determined after the above lab and MRI results reviewed  2) Acute upper abdominal pain x 1 day which resulted in the ED evaluation 12/17/2021 which identified elevated AST/ALT levels and a right liver mass is noted above.  -Patient to contact office if abdominal pain recurs  3) Colon cancer screening. 49 brother was diagnosed with colon cancer in his 80s. -Colonoscopy benefits and risks discussed including risk with sedation, risk of bleeding, perforation and infection  -Patient will call office when she is ready to schedule a colonoscopy       CC:  Nolene Ebbs, MD

## 2022-01-22 NOTE — Patient Instructions (Signed)
Your provider has requested that you go to the basement level for lab work before leaving today. Press "B" on the elevator. The lab is located at the first door on the left as you exit the elevator.   You have been scheduled for an MRI at [facility] on 02/03/22 . Your appointment time is 8 am. Please arrive to admitting (at main entrance of the hospital) 30 minutes prior to your appointment time for registration purposes. Please make certain not to have anything to eat or drink 4 hours prior to your test. In addition, if you have any metal in your body, have a pacemaker or defibrillator, please be sure to let your ordering physician know. This test typically takes 45 minutes to 1 hour to complete. Should you need to reschedule, please call [phone] to do so.   Due to recent changes in healthcare laws, you may see the results of your imaging and laboratory studies on MyChart before your provider has had a chance to review them.  We understand that in some cases there may be results that are confusing or concerning to you. Not all laboratory results come back in the same time frame and the provider may be waiting for multiple results in order to interpret others.  Please give [place] 48 hours in order for your provider to thoroughly review all the results before contacting the office for clarification of your results.    Thank you for trusting me with your gastrointestinal care!   Carl Best, CRNP

## 2022-01-25 LAB — HEPATITIS A ANTIBODY, TOTAL: Hepatitis A AB,Total: NONREACTIVE

## 2022-01-25 LAB — HEPATITIS B SURFACE ANTIGEN: Hepatitis B Surface Ag: NONREACTIVE

## 2022-01-25 LAB — HEPATITIS C ANTIBODY: Hepatitis C Ab: NONREACTIVE

## 2022-01-25 LAB — AFP TUMOR MARKER: AFP-Tumor Marker: 3.1 ng/mL

## 2022-01-25 LAB — HEPATITIS B CORE ANTIBODY, TOTAL: Hep B Core Total Ab: NONREACTIVE

## 2022-01-25 LAB — HEPATITIS B SURFACE ANTIBODY,QUALITATIVE: Hep B S Ab: REACTIVE — AB

## 2022-01-25 NOTE — Progress Notes (Signed)
____________________________________________________________  Attending physician addendum:  Thank you for sending this case to me. I have reviewed the entire note and agree with the plan.  If you order MRI liver with and without contrast to evaluate liver mass seen on CT scan, the radiologist and technician will know how to schedule and protocol the imaging study.   Wilfrid Lund, MD  ____________________________________________________________

## 2022-01-26 NOTE — Progress Notes (Signed)
DD, please change the abdominal MRI w/wo out order to a MRI liver with and without contrast to evaluate liver mass seen on CT scan, the radiologist and technician will know how to schedule and protocol the imaging study.  THX

## 2022-01-26 NOTE — Addendum Note (Signed)
Addended by: Annabell Howells on: 01/26/2022 08:38 AM   Modules accepted: Orders

## 2022-01-29 ENCOUNTER — Telehealth: Payer: Self-pay | Admitting: Nurse Practitioner

## 2022-01-29 NOTE — Telephone Encounter (Signed)
Spoke with Pt. Documented under result notes:  Pt verbalized understanding with all questions answered.   

## 2022-01-29 NOTE — Telephone Encounter (Signed)
Patient returning phone call from [place].

## 2022-02-03 ENCOUNTER — Ambulatory Visit (HOSPITAL_COMMUNITY): Payer: Self-pay

## 2022-02-16 ENCOUNTER — Ambulatory Visit
Admission: RE | Admit: 2022-02-16 | Discharge: 2022-02-16 | Disposition: A | Discharge status: Home / Self Care | Payer: No Typology Code available for payment source | Source: Ambulatory Visit | Attending: Nurse Practitioner | Admitting: Nurse Practitioner

## 2022-02-16 ENCOUNTER — Ambulatory Visit (HOSPITAL_COMMUNITY): Payer: Self-pay

## 2022-02-16 DIAGNOSIS — R160 Hepatomegaly, not elsewhere classified: Secondary | ICD-10-CM | POA: Insufficient documentation

## 2022-02-16 DIAGNOSIS — Z1211 Encounter for screening for malignant neoplasm of colon: Secondary | ICD-10-CM | POA: Diagnosis present

## 2022-02-16 DIAGNOSIS — R7989 Other specified abnormal findings of blood chemistry: Secondary | ICD-10-CM | POA: Diagnosis present

## 2022-02-16 MED ORDER — GADOBUTROL 1 MMOL/ML IV SOLN
7.0000 mL | Freq: Once | INTRAVENOUS | Status: AC | PRN
  Administered 2022-02-16: 7 mL via INTRAVENOUS

## 2022-02-22 ENCOUNTER — Telehealth: Payer: Self-pay | Admitting: Nurse Practitioner

## 2022-02-22 NOTE — Telephone Encounter (Signed)
Pt was notified that we are still awaiting  the final results from the MRI and that Dr. Loletha Carrow has reached out to the radiologist for clarification: Pt was made aware that as soon as Dr. Loletha Carrow hears from the radiologist and reviews the imaging and makes any recommendations then we will reach out to her and make her aware:  Pt verbalized understanding with all questions answered.

## 2022-02-22 NOTE — Telephone Encounter (Signed)
Patient called to follow up on MRI results.

## 2022-02-25 ENCOUNTER — Telehealth: Payer: Self-pay | Admitting: Nurse Practitioner

## 2022-02-25 NOTE — Telephone Encounter (Signed)
Patient is calling wishing to speak with [facility] regarding recent results. Please advise

## 2022-02-26 NOTE — Telephone Encounter (Signed)
Please see note below. 

## 2022-02-26 NOTE — Telephone Encounter (Signed)
PT is returning call. Please advise.  

## 2022-02-26 NOTE — Telephone Encounter (Signed)
I attempted to call the patient at this time but she did not answer and I left a message on her voicemail to call me back.  I will gladly review in further detail her liver MRI results.

## 2022-02-28 NOTE — Telephone Encounter (Signed)
I returned the patient's call back later on Friday afternoon 02/26/2021 and again reached her voicemail. Instructed patient to call me back. Remo Lipps, if patient returns my call, pls verify is she is reachable around 1pm or 4pm and I will try to call her back. THX

## 2022-03-01 NOTE — Telephone Encounter (Signed)
Carol Chan, I spoke to patient and answered all of her questions. She will contact our office if she has any significant abdominal pain. Pls enter abd MRI recall 6 months if not already done. THX.

## 2022-03-01 NOTE — Telephone Encounter (Signed)
Pt stated that she would be available at 4 pm today:

## 2022-03-02 NOTE — Telephone Encounter (Signed)
Chart reviewed and noted previous documentation:   Gillermina Hu, RN 02/26/2022  9:28 AM EST     Reminder placed in EPIC to repeat MRI in 6 months

## 2022-09-03 ENCOUNTER — Telehealth: Payer: Self-pay

## 2022-09-03 NOTE — Telephone Encounter (Signed)
Spoke with patient & reminder her to schedule 6 month follow up MRI of liver. At this time she is unable to schedule d/t medical expenses that she needs to pay off. She has number for radiology & advised to call when she is ready and to give our office a call back with any questions. Pt verbalized all understanding.

## 2022-09-30 ENCOUNTER — Other Ambulatory Visit: Payer: Self-pay | Admitting: Internal Medicine

## 2022-10-01 LAB — LIPID PANEL
Cholesterol: 173 mg/dL (ref <200)
HDL: 67 mg/dL (ref >=50)
LDL Cholesterol (Calc): 96 mg/dL
Non-HDL Cholesterol (Calc): 106 mg/dL (ref <130)
Total CHOL/HDL Ratio: 2.6 (calc) (ref <5.0)
Triglycerides: 33 mg/dL (ref <150)

## 2022-10-01 LAB — CBC
HCT: 38.2 % (ref 35.0–45.0)
Hemoglobin: 13 g/dL (ref 11.7–15.5)
MCH: 31.7 pg (ref 27.0–33.0)
MCHC: 34 g/dL (ref 32.0–36.0)
MCV: 93.2 fL (ref 80.0–100.0)
MPV: 10 fL (ref 7.5–12.5)
Platelets: 262 10*3/uL (ref 140–400)
RBC: 4.1 10*6/uL (ref 3.80–5.10)
RDW: 12 % (ref 11.0–15.0)
WBC: 5.7 10*3/uL (ref 3.8–10.8)

## 2022-10-01 LAB — VITAMIN D 25 HYDROXY (VIT D DEFICIENCY, FRACTURES): Vit D, 25-Hydroxy: 28 ng/mL — ABNORMAL LOW (ref 30–100)

## 2022-10-01 LAB — FOLATE: Folate: 14.7 ng/mL

## 2022-10-01 LAB — COMPLETE METABOLIC PANEL WITH GFR
AG Ratio: 1.5 (calc) (ref 1.0–2.5)
ALT: 18 U/L (ref 6–29)
AST: 16 U/L (ref 10–35)
Albumin: 4.4 g/dL (ref 3.6–5.1)
Alkaline phosphatase (APISO): 43 U/L (ref 37–153)
BUN: 11 mg/dL (ref 7–25)
CO2: 19 mmol/L — ABNORMAL LOW (ref 20–32)
Calcium: 9.1 mg/dL (ref 8.6–10.4)
Chloride: 104 mmol/L (ref 98–110)
Creat: 0.64 mg/dL (ref 0.50–1.03)
Globulin: 2.9 g/dL (calc) (ref 1.9–3.7)
Glucose, Bld: 75 mg/dL (ref 65–99)
Potassium: 3.9 mmol/L (ref 3.5–5.3)
Sodium: 136 mmol/L (ref 135–146)
Total Bilirubin: 0.6 mg/dL (ref 0.2–1.2)
Total Protein: 7.3 g/dL (ref 6.1–8.1)
eGFR: 107 mL/min/{1.73_m2} (ref >=60)

## 2022-10-01 LAB — MAGNESIUM: Magnesium: 1.9 mg/dL (ref 1.5–2.5)

## 2022-10-01 LAB — TSH: TSH: 2.12 m[IU]/L

## 2022-10-01 LAB — VITAMIN B12: Vitamin B-12: 479 pg/mL (ref 200–1100)

## 2023-08-16 ENCOUNTER — Other Ambulatory Visit: Payer: Self-pay

## 2023-08-16 DIAGNOSIS — Z1231 Encounter for screening mammogram for malignant neoplasm of breast: Secondary | ICD-10-CM

## 2023-08-24 ENCOUNTER — Ambulatory Visit

## 2023-09-17 IMAGING — MG MM DIGITAL SCREENING BILAT W/ TOMO AND CAD
8 series · 8 of 24 positions shown · non-contrast
Comparison: Previous exam(s).

CLINICAL DATA: Screening.

EXAM:
DIGITAL SCREENING BILATERAL MAMMOGRAM WITH TOMOSYNTHESIS AND CAD
TECHNIQUE: Bilateral screening digital craniocaudal and mediolateral oblique
mammograms were obtained. Bilateral screening digital breast
tomosynthesis was performed. The images were evaluated with
computer-aided detection.

[L CC synth-2D]
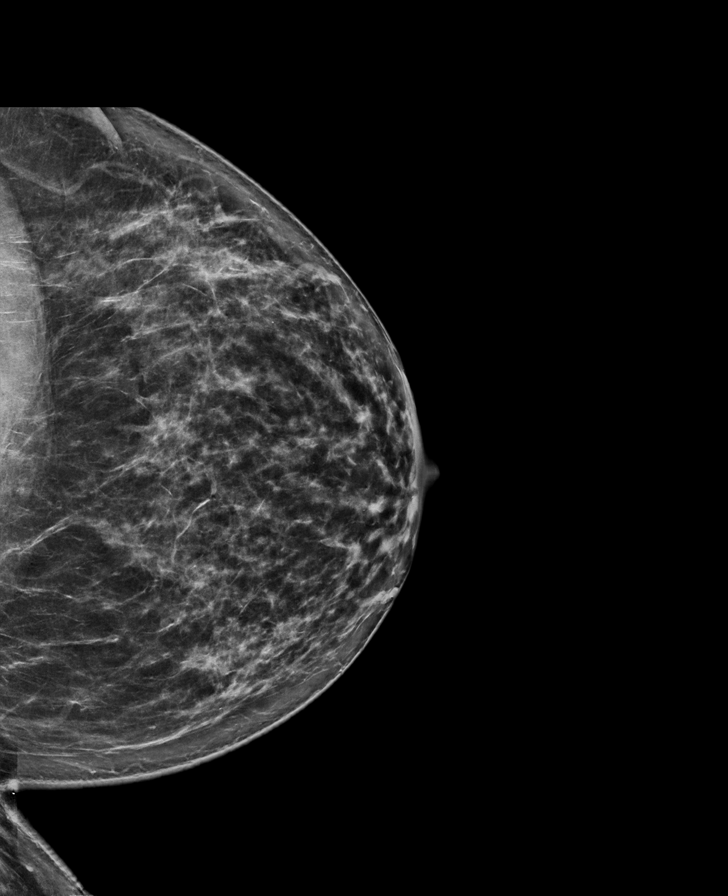

[L MLO synth-2D]
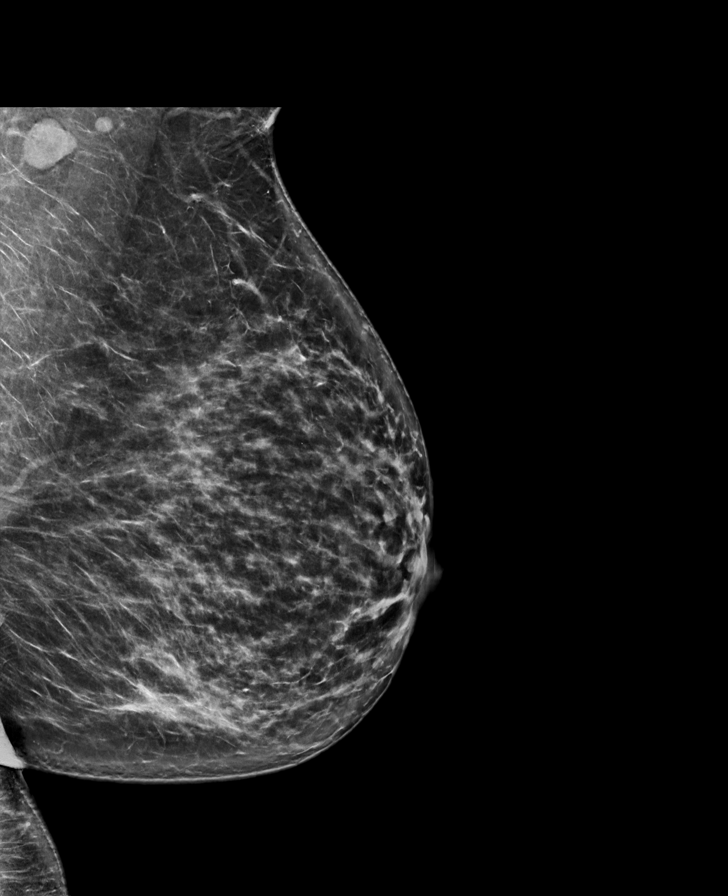

[R MLO synth-2D]
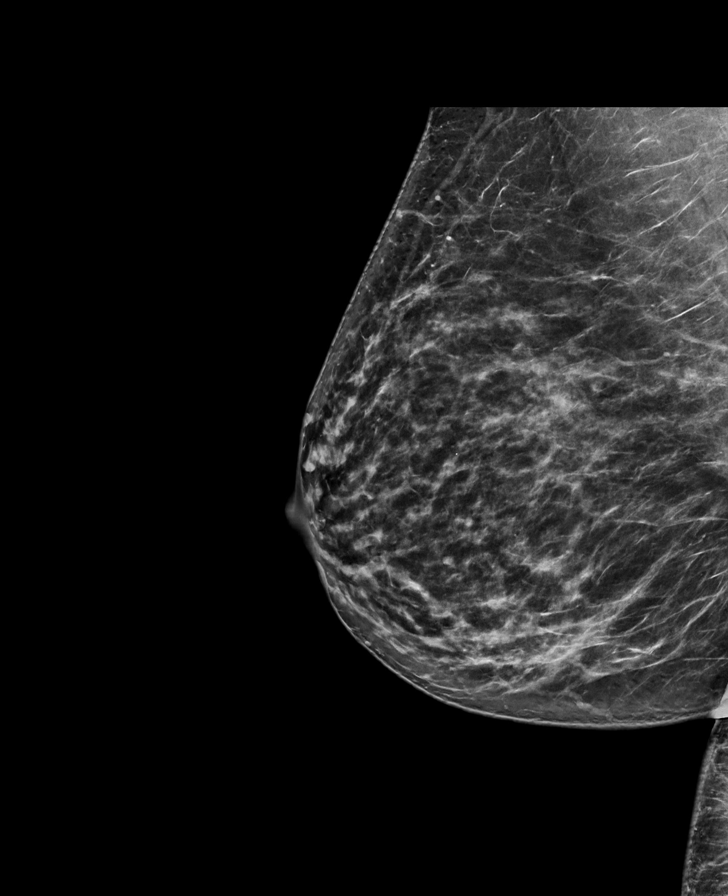

[R CC synth-2D]
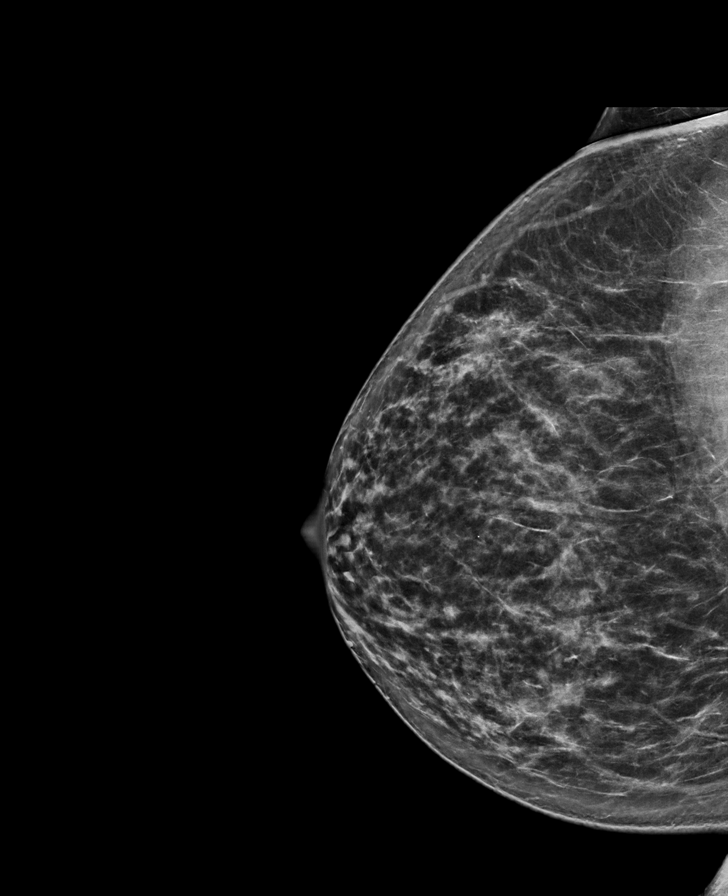

[L CC tomo · tomo slice 41/81.0]
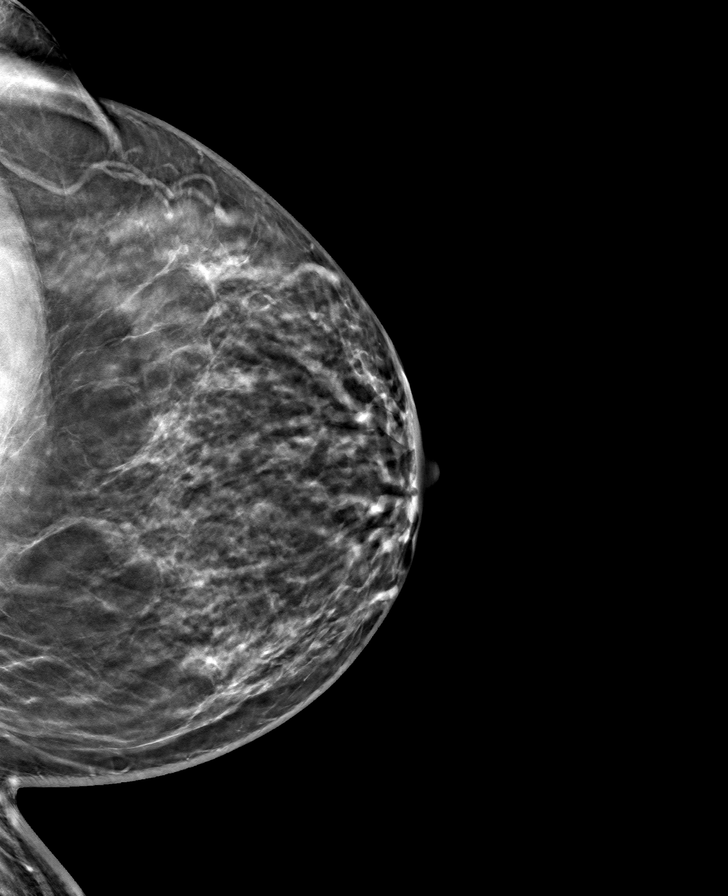

[R MLO tomo · tomo slice 40/79.0]
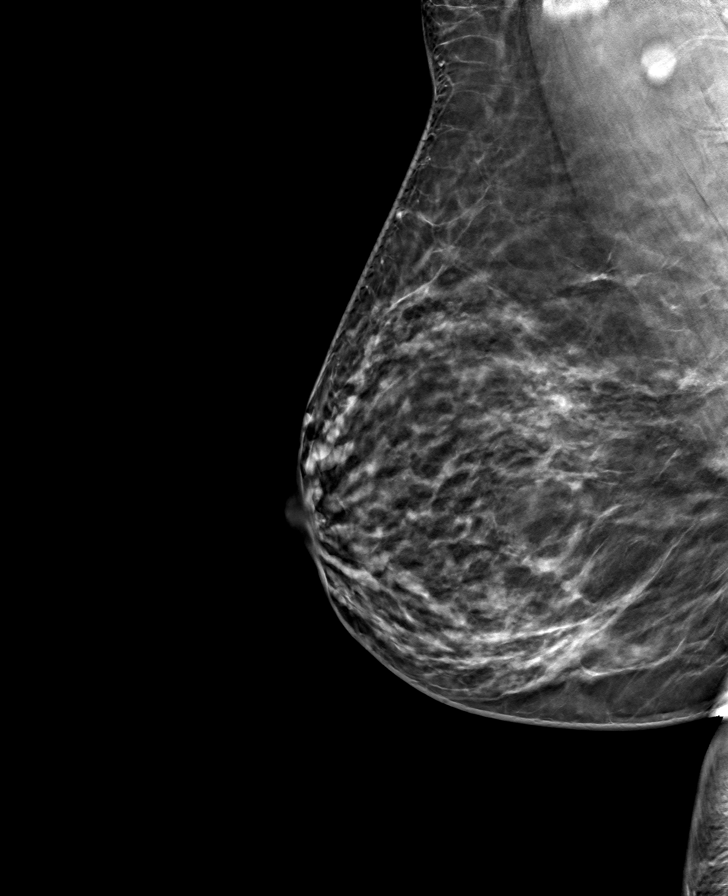

[L MLO tomo · tomo slice 42/83.0]
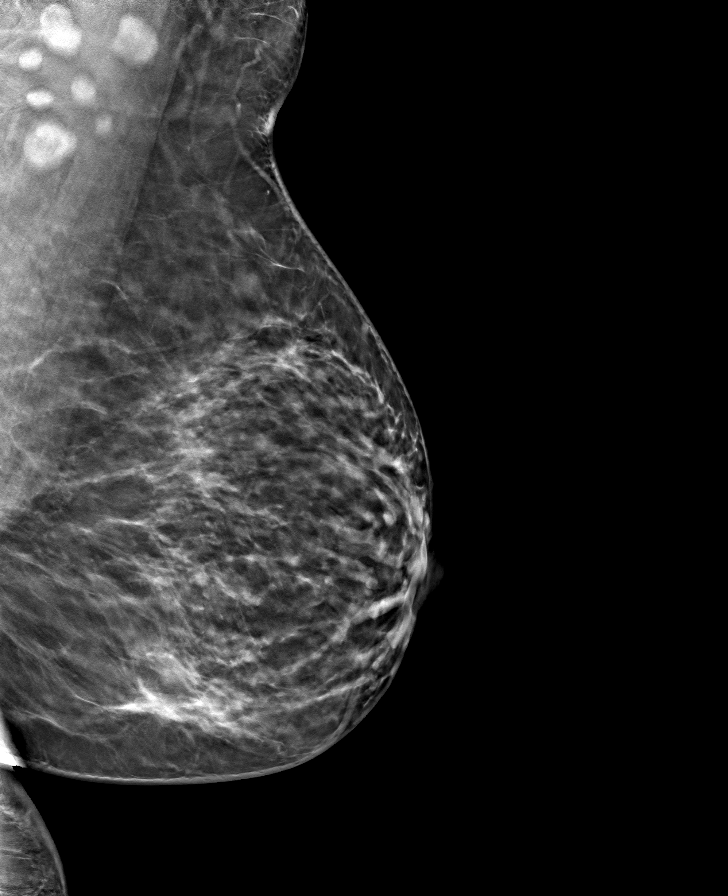

[R CC tomo · tomo slice 41/80.0]
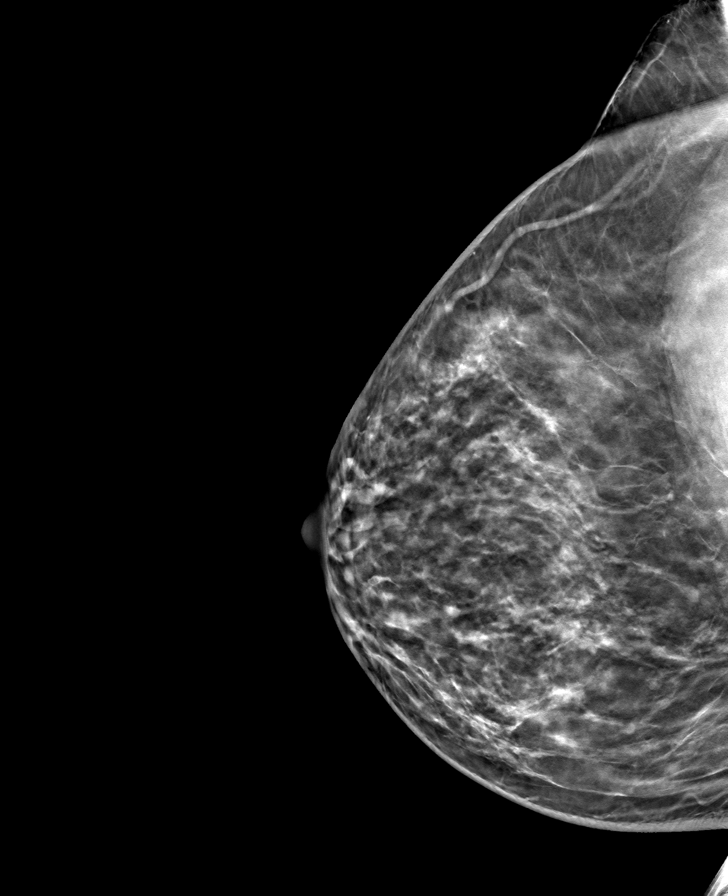

[8 of 24 positions shown; findings below may reference images not displayed]

ACR Breast Density Category b: There are scattered areas of
fibroglandular density.
FINDINGS: There are no findings suspicious for malignancy.
IMPRESSION: No mammographic evidence of malignancy. A result letter of this
screening mammogram will be mailed directly to the patient.

RECOMMENDATION:
Screening mammogram in one year. (Code:51-O-LD2)

BI-RADS CATEGORY  1: Negative.

## 2023-10-04 ENCOUNTER — Ambulatory Visit
Admission: RE | Admit: 2023-10-04 | Discharge: 2023-10-04 | Disposition: A | Discharge status: Home / Self Care | Source: Ambulatory Visit

## 2023-10-04 DIAGNOSIS — Z1231 Encounter for screening mammogram for malignant neoplasm of breast: Secondary | ICD-10-CM

## 2024-01-25 LAB — COLOGUARD: Cologuard: POSITIVE — AB
